# Patient Record
Sex: Male | Born: 2005 | Race: Asian | Hispanic: No | Marital: Single | Smoking: Never smoker
Health system: Southern US, Community
[De-identification: ages and names within clinical notes are randomized; demographics above are authoritative.]

---
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
## 2024-01-03 DIAGNOSIS — M542 Cervicalgia: Secondary | ICD-10-CM | POA: Diagnosis not present

## 2024-01-03 DIAGNOSIS — R293 Abnormal posture: Secondary | ICD-10-CM | POA: Diagnosis not present

## 2024-01-03 DIAGNOSIS — M25512 Pain in left shoulder: Secondary | ICD-10-CM | POA: Diagnosis not present

## 2024-01-06 DIAGNOSIS — R293 Abnormal posture: Secondary | ICD-10-CM | POA: Diagnosis not present

## 2024-01-06 DIAGNOSIS — M542 Cervicalgia: Secondary | ICD-10-CM | POA: Diagnosis not present

## 2024-01-06 DIAGNOSIS — M25512 Pain in left shoulder: Secondary | ICD-10-CM | POA: Diagnosis not present

## 2024-01-13 DIAGNOSIS — M542 Cervicalgia: Secondary | ICD-10-CM | POA: Diagnosis not present

## 2024-01-13 DIAGNOSIS — R293 Abnormal posture: Secondary | ICD-10-CM | POA: Diagnosis not present

## 2024-01-13 DIAGNOSIS — M25512 Pain in left shoulder: Secondary | ICD-10-CM | POA: Diagnosis not present

## 2024-01-16 DIAGNOSIS — M25512 Pain in left shoulder: Secondary | ICD-10-CM | POA: Diagnosis not present

## 2024-01-27 DIAGNOSIS — R293 Abnormal posture: Secondary | ICD-10-CM | POA: Diagnosis not present

## 2024-01-27 DIAGNOSIS — M25512 Pain in left shoulder: Secondary | ICD-10-CM | POA: Diagnosis not present

## 2024-01-27 DIAGNOSIS — M542 Cervicalgia: Secondary | ICD-10-CM | POA: Diagnosis not present
